# Patient Record
Sex: Male | Born: 1960 | Race: White | Hispanic: Yes | Marital: Married | Smoking: Never smoker
Health system: Southern US, Community
[De-identification: ages and names within clinical notes are randomized; demographics above are authoritative.]

## PROBLEM LIST (undated history)

## (undated) DIAGNOSIS — I359 Nonrheumatic aortic valve disorder, unspecified: Secondary | ICD-10-CM

## (undated) HISTORY — DX: Nonrheumatic aortic valve disorder, unspecified: I35.9

---

## 2002-05-04 HISTORY — PX: CARDIAC SURGERY: SHX584

## 2003-01-10 ENCOUNTER — Ambulatory Visit (HOSPITAL_COMMUNITY): Admission: RE | Admit: 2003-01-10 | Discharge: 2003-01-11 | Payer: Self-pay | Admitting: Cardiology

## 2003-01-11 ENCOUNTER — Encounter: Payer: Self-pay | Admitting: Cardiology

## 2003-01-17 ENCOUNTER — Encounter: Payer: Self-pay | Admitting: Cardiothoracic Surgery

## 2003-01-17 ENCOUNTER — Encounter (INDEPENDENT_AMBULATORY_CARE_PROVIDER_SITE_OTHER): Payer: Self-pay | Admitting: Specialist

## 2003-01-17 ENCOUNTER — Inpatient Hospital Stay (HOSPITAL_COMMUNITY): Admission: RE | Admit: 2003-01-17 | Discharge: 2003-01-25 | Payer: Self-pay | Admitting: Cardiothoracic Surgery

## 2003-01-18 ENCOUNTER — Encounter: Payer: Self-pay | Admitting: Cardiothoracic Surgery

## 2003-01-19 ENCOUNTER — Encounter: Payer: Self-pay | Admitting: Cardiothoracic Surgery

## 2003-01-20 ENCOUNTER — Encounter: Payer: Self-pay | Admitting: Cardiothoracic Surgery

## 2003-01-21 ENCOUNTER — Encounter: Payer: Self-pay | Admitting: Thoracic Surgery (Cardiothoracic Vascular Surgery)

## 2003-01-22 ENCOUNTER — Encounter: Payer: Self-pay | Admitting: Cardiothoracic Surgery

## 2003-01-23 ENCOUNTER — Encounter: Payer: Self-pay | Admitting: Cardiology

## 2003-01-24 ENCOUNTER — Encounter: Payer: Self-pay | Admitting: Cardiothoracic Surgery

## 2008-04-26 ENCOUNTER — Ambulatory Visit: Payer: Self-pay | Admitting: *Deleted

## 2008-05-04 HISTORY — PX: RENAL ARTERY BYPASS: SHX2318

## 2008-08-15 ENCOUNTER — Ambulatory Visit: Payer: Self-pay | Admitting: *Deleted

## 2008-08-15 ENCOUNTER — Ambulatory Visit (HOSPITAL_COMMUNITY): Admission: RE | Admit: 2008-08-15 | Discharge: 2008-08-15 | Payer: Self-pay | Admitting: *Deleted

## 2010-08-13 LAB — POCT I-STAT, CHEM 8
BUN: 10 mg/dL (ref 6–23)
Calcium, Ion: 1.13 mmol/L (ref 1.12–1.32)
Chloride: 105 mEq/L (ref 96–112)
Creatinine, Ser: 1.1 mg/dL (ref 0.4–1.5)
Glucose, Bld: 136 mg/dL — ABNORMAL HIGH (ref 70–99)

## 2010-09-16 NOTE — Op Note (Signed)
NAMEKRISTINA, BERTONE                 ACCOUNT NO.:  [id number]   MEDICAL RECORD NO.:  [id number]          PATIENT TYPE:  AMB   LOCATION:  SDS                          FACILITY:  [facility]   PHYSICIAN:  Balinda Quails, M.D.    DATE OF BIRTH:  Aug 17, 1960   DATE OF PROCEDURE:  DATE OF DISCHARGE:                               OPERATIVE REPORT   PHYSICIAN:  Balinda Quails, MD   DIAGNOSIS:  Right renal artery aneurysm.   PROCEDURES:  1. Abdominal aortogram.  2. Right renal arteriogram.   ACCESS:  Right common femoral artery 5-French sheath.   CONTRAST:  100 mL of Visipaque.   COMPLICATIONS:  None apparent.   CLINICAL NOTE:  Vance Hochmuth is a 50 year old male with a history of  aortic valve replacement and single functioning right kidney.  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  He has a history of hypertension,  hyperlipidemia.   Recent CT scan revealed evidence of right renal artery aneurysm.  No  evidence of significant left renal function.  Brought to the cath lab at  this time for diagnostic workup with arteriography.   PROCEDURE NOTE:  The patient brought to cath lab in stable condition.  Placed in supine position.  Both groins prepped and draped in sterile  fashion.  Right groin instilled 1% Xylocaine.  An 18-gauge needle  introduced into right common femoral artery.  0.035 Wholey guidewire  advanced through the needle into the mid abdominal aorta.  A 5-French  sheath advanced over the guidewire.   Pigtail catheter advanced over guidewire to the juxtarenal aorta.  AP  abdominal aortogram obtained.  This verified a single functioning right  kidney.  No significant left kidney function identified.  No left renal  artery identified.  The infrarenal aorta was normal.  The common and  external and hypogastric iliac arteries bilaterally were normal.   Guidewire then reinserted and a short right coronary catheter advanced  over the guidewire.  This was engaged in the right renal  artery origin.   Hand injection of contrast used to obtain right renal arteriography.  This revealed a large right renal artery, which was widely patent.  There was a saccular aneurysm of the terminal main renal artery at the  origin of the first division branches of which there were three.  Several images of this were taken.   There were no apparent complications of the procedure.  The guidewire  reinserted.  Catheter removed.  Right femoral sheath removed.  Total  contrast 100 mL Visipaque.   FINAL IMPRESSION:  1. Normal infrarenal aorta and iliac segments.  2. Widely patent right renal artery.  3. Single right kidney function.  4. 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   DISPOSITION:  These results have been reviewed with the patient and his  wife.  Dr. Salli Real at [facility] will be contacted  for further management of this complex problem.      Balinda Quails, M.D.  Electronically Signed     PGH/MEDQ  D:  08/15/2008  T:  08/16/2008  Job:  [id number]   cc:   Fayrene Fearing L. Deterding, M.D.  Dina Rich  [place] J. Stevphen Rochester, MD

## 2010-09-16 NOTE — Consult Note (Signed)
VASCULAR SURGERY CONSULTATION   ELIN, SEATS S  DOB:  1960-07-22                                       04/26/2008  CHART#:[id number]   PRIMARY CARE PHYSICIAN:  Dina Rich, M.D.   REFERRAL DIAGNOSIS:  Right renal artery aneurysm.   HISTORY:  The patient is a 50 year old gentleman with a history of  aortic valve replacement carried out by Dr. Tyrone Sage in 2004.  He has a  history of abdominal aortic aneurysm and an atrophic left kidney.  Right  renal artery aneurysm reported to be 2 cm in diameter.   A long history of hypertension and dyslipidemia.   PAST MEDICAL HISTORY:  1. Coronary artery disease.  2. Essential hypertension.  3. Aortic valve replacement.  4. Dyslipidemia.   MEDICATIONS:  1. Aspirin 81 mg daily.  2. Lipitor 80 mg every other day.  3. Lipitor 40 mg every other day.  4. Ramipril 2.5 mg twice daily.  5. Metoprolol 50 mg twice daily.  6. Coumadin 7 mg every other day.  7. Coumadin 6 mg every other day.   ALLERGIES:  1. Penicillin.  2. Sulfa.   SOCIAL HISTORY:  The patient is married, no children.  No tobacco use.  Social alcohol intake.  Works as a Oncologist.  Married.   FAMILY HISTORY:  Mother deceased age 60, she had a history of mitral  valve replacement, diabetes and gallbladder problems.  Father living age  38 with history of an MI.   REVIEW OF SYSTEMS:  Refer to patient encounter form.  Weight stable, no  anorexia.  Denies fever or chills.  No recent chest pain or shortness of  breath.  No supplemental oxygen use.  No chronic cough.  No GI symptoms.  Denies dysuria or frequency.   PHYSICAL EXAMINATION:  General:  A well-appearing 50 year old gentleman.  Alert and oriented, no acute distress.  BP 141/84, pulse is 75 per  minute.  Abdomen:  Soft, nontender.  No masses or organomegaly.  Normal  bowel sounds without bruits.  Lower Extremities:  With intact femoral  pulses bilaterally.  No ankle edema.   IMPRESSION:  1. Right renal artery aneurysm by CT scan.  2. Hypertension.  3. Dyslipidemia.  4. Coronary artery disease.  5. Aortic valve replacement.   PLAN:  Will obtain images of a CT scan.  Once these are reviewed,  consider scheduling of abdominal aortogram with renal arteriography for  further evaluation.   Balinda Quails, M.D.  Electronically Signed  PGH/MEDQ  D:  04/26/2008  T:  04/30/2008  Job:  [id number]   cc:   Aundra Dubin. Revankar, M.D.  Dina Rich

## 2010-09-19 NOTE — Discharge Summary (Signed)
NAMEMURRY, DIAZ                           ACCOUNT NO.:  [id number]   MEDICAL RECORD NO.:  [id number]                   PATIENT TYPE:  OIB   LOCATION:  4728                                 FACILITY:  [facility]   PHYSICIAN:  Arturo Morton. Riley Kill, M.D.             DATE OF BIRTH:  [birth year]   DATE OF ADMISSION:  01/10/2003  DATE OF DISCHARGE:  01/11/2003                                 DISCHARGE SUMMARY   DISCHARGE DIAGNOSES:  1. Critical aortic stenosis.  2. Hypertension.  3. Dyslipidemia.   PROCEDURES PERFORMED THIS ADMISSION:  Cardiac catheterization on January 10, 2003, by Dr. Riley Kill revealing critical aortic stenosis with a mean  aortic valve gradient of 85 mmHg, trivial nonobstructive coronary artery  disease with 30% LAD stenosis, and a 15 mmHg pulmonary valve gradient.   CONSULTATIONS THIS ADMISSION:  CVTS.   HISTORY OF PRESENT ILLNESS:  Briefly, this 50 year old male, husband of  Elease Hashimoto A. Benedetto Goad, M.D., with known aortic stenosis, presented to Dr.  Kem Parkinson office on January 11, 2003, with complaints of exertional chest  discomfort.  He was set up for elective cardiac catheterization to further  evaluate his aortic stenosis and coronary anatomy.  He was brought in on  January 10, 2003, by Dr. Riley Kill.   HOSPITAL COURSE:  He underwent procedure as noted above.  He tolerated the  procedure well and had no immediate complications.  He underwent  preoperative transesophageal echocardiogram to further evaluate his  pulmonary valve.  I do not have that report at this time.  Apparently it  looked okay, per the nursing staff.  Dr. Tyrone Sage saw the patient in CVTS  consultation.  They plan aortic valve replacement on January 17, 2003.  The patient is being discharged to home today with return set up for  January 17, 2003.   LABORATORY DATA:  White count 6500, hemoglobin 12.6, hematocrit 37.3,  platelet count 182,000.  INR 1.1.  Sodium 138, potassium 3.5, chloride  108,  CO2 22, glucose 113, BUN 11, creatinine 0.9, total bilirubin 1.0, alkaline  phosphatase 58, AST 25, ALT 38, total bilirubin 6.7, albumin 3.6, calcium  8.4.  Hemoglobin A1C 5.6.   DISCHARGE MEDICATIONS:  Tylenol p.r.n. pain.   ACTIVITY:  No driving, heavy lifting, exertion, work, or sex until he  returns for his surgery.   DIET:  Low fat, low sodium.   WOUND CARE:  The patient is to call our office in [place], [place], for any groin swelling, bleeding, or bruising.    FOLLOWUP:  He is to return to [facility] on January 17, 2003, for aortic  valve replacement surgery by Dr. Tyrone Sage.      Tereso Newcomer, P.A.                        Arturo Morton. Riley Kill, M.D.    SW/MEDQ  D:  01/11/2003  T:  01/11/2003  Job:  [id number]   cc:   Gwenith Daily. Tyrone Sage, M.D.  [address]  [place]  [place] [address]

## 2010-09-19 NOTE — Discharge Summary (Signed)
NAMEKAHLIL, COWANS                           ACCOUNT NO.:  [id number]   MEDICAL RECORD NO.:  [id number]                   PATIENT TYPE:  INP   LOCATION:  2012                                 FACILITY:  [facility]   PHYSICIAN:  Pecola Leisure, PA                DATE OF BIRTH:  08-01-1960   DATE OF ADMISSION:  01/17/2003  DATE OF DISCHARGE:                                 DISCHARGE SUMMARY   ADMIT DIAGNOSIS:  Critical aortic stenosis.   PAST MEDICAL HISTORY:  1. Hypertension.  2. Dyslipidemia.  3. Aortic stenosis.   ALLERGIES:  1. SULFA.  2. PENICILLIN.   DISCHARGE DIAGNOSES:  1. Aortic stenosis, status post aortic valve replacement with a St. Jude     valve.   BRIEF HISTORY:  The patient is a 50 year old male with a long-standing  history of a murmur, who presented to Dr. Aundra Dubin. Revankar with increasing  episodes of shortness of breath and vague chest discomfort.  This led to  further evaluation with echocardiogram and cardiac catheterization, which  confirmed the diagnosis of critical aortic stenosis.  The patient was then  referred to Dr. Ramon Dredge B. Gerhardt for evaluation of aortic stenosis and  valve replacement surgery.  Prior to surgery, transesophageal  echocardiography and transthoracic echocardiographies were performed,  demonstrating mild infundibular stenosis but with a normal pulmonic valve.  Transesophageal echocardiography revealed a freely movable pulmonic valve.  It was Dr. Dennie Maizes opinion that the patient should undergo aortic valve  replacement surgery with a St. Jude mechanical valve prosthesis.   HOSPITAL COURSE:  The patient was admitted on January 10, 2003 for cardiac  catheterization prior to aortic valve replacement surgery.  The  catheterization revealed a normal left ventricular function, 30% occlusion  of the left circumflex and severe aortic stenosis.  The patient was  discharged home after the cardiac catheterization.  The patient was  readmitted and taken to the OR on January 17, 2003 for aortic valve  replacement with a 23-mm St. Jude aortic valve.  The patient tolerated the  procedure well.  He was hemodynamically stable immediately postoperatively  and was extubated without problem.  The patient woke up from anesthesia  neurologically intact.  The patient remained stable in the ICCU and was  transferred to the 2000 unit of postoperative day 4.  Coumadin therapy was  started on postoperative day 1.  The patient began cardiac rehab on  postoperative day 3 and tolerated it well.  Despite increases in Coumadin  dose, the patient's INR was not therapeutic, therefore, Lovenox was started  on postoperative day #5.  The patient had an episode of sinus tachycardia on  postoperative day #5 and as a result, the Lopressor dose was increased.  The  patient has remained stable postoperatively and progressed well.  The  patient will be discharged on Lovenox, which will be given at home by his  wife.  LABORATORY DATA:  CBC on January 20, 2003:  White count 8.4, hemoglobin  10.3, hematocrit 29.9, platelets 203,000.  BMP on January 22, 2003:  Sodium 139, potassium 4.5, BUN 11, creatinine 0.9 and glucose 127.  PT/INR  on January 23, 2003 are 14.3 and 1.2.   CONDITION ON DISCHARGE:  Condition on discharge is improved.   INSTRUCTIONS:   MEDICATIONS:  1. Coumadin -- the dose will be determined prior to discharge.  2. Lovenox 80 mg subcu injection q.12 h.  3. Colace 100 mg two tabs p.o. daily.  4. Folic acid 1 mg p.o. daily.  5. Altace 2.5 mg one p.o. b.i.d.  6. Lopressor 50 mg one p.o. b.i.d.  7. Vioxx 25 mg one p.o. daily.  8. Pain management -- Tylox one to two p.o. q.4-6 h. p.r.n. for pain.   ACTIVITY:  1. No driving until followup with the surgeon.  2. No heavy lifting, pulling or pushing.  3. Continue daily walks and breathing exercises.   DIET:  Heart-healthy.   WOUND CARE:  1. Shower daily.  2. Cleaning  wounds with soap and water.  3. If the incisions become red, swollen, painful or draining or fever of     101, call CVTS.  4. No creams or lotions on the incisions.   SPECIAL INSTRUCTIONS:  The patient is to call the CVTS office with any  questions or problems.   FOLLOWUP:  1. Dr. Tyrone Sage, Thursday, March 15, 2003, at 12:30 p.m.  2. Dr. Maisie Fus D. Stuckey; Trish from the [facility] will discuss the     followup appointment with the patient.  The PT/INR will be followed by     Dr. Rosalyn Charters office and the patient is to have this level check on     Friday, January 26, 2003.                                                Pecola Leisure, PA    AY/MEDQ  D:  01/23/2003  T:  01/25/2003  Job:  [id number]   cc:   Gwenith Daily. Tyrone Sage, M.D.  [address]  [place]  [place] [address]   Arturo Morton. Riley Kill, M.D.

## 2010-09-19 NOTE — Cardiovascular Report (Signed)
NAMEKEYION, KNACK                           ACCOUNT NO.:  [id number]   MEDICAL RECORD NO.:  [id number]                   PATIENT TYPE:  OIB   LOCATION:  4728                                 FACILITY:  [facility]   PHYSICIAN:  Arturo Morton. Riley Kill, M.D.             DATE OF BIRTH:  Mar 01, 1961   DATE OF PROCEDURE:  01/10/2003  DATE OF DISCHARGE:  01/11/2003                              CARDIAC CATHETERIZATION   INDICATIONS:  Mr. Withers is a very delightful 50 year old gentleman who  presents with some progressive shortness of breath and chest tightness.  This gentleman has a known history of aortic valvular stenosis.  Repeat  echocardiogram demonstrated significant aortic valvular disease.  The  current study was done to assess his aortic valve gradient as well as to  assess coronary anatomy.   DESCRIPTION OF PROCEDURE:  The patient was brought to the catheterization  lab and prepped and draped in the usual fashion.  Through an anterior  puncture, the right femoral vein was entered.  Right heart catheterization  was performed using a 7.5 French thermodilution Swan-Ganz catheter through  an 8 French sheath.  Saturations were obtained in the pulmonary artery.  Following right heart catheterization, the right femoral artery was entered  and a 6 French sheath was placed.  We were able to cross the valve using a  right coronary catheter and a straight wire.  A ventricular gradient was  measured.  Because of prior echocardiography, no ventriculogram was  performed.  There was some difficulty keeping the left coronary catheter in  the left coronary ostium because of the jet force of the aortic gradient.  Views of the left and right coronary arteries were obtained.  Incidentally,  a gradient was noted across the pulmonic outflow tract.  As a result, this  was measured on several occasions.  Aortography was performed without  complication.  All catheters were subsequently removed and hemostasis  achieved in the holding area by direct manual compression.   HEMODYNAMIC DATA:  1. Right atrium 11/10/8.  2. Right ventricle 39/5/10.  3. Pulmonary artery 26/17/21.  4. Pulmonary capillary wedge 18/15/15.  5. Aorta 146/103/122.  6. Left ventricle 273/23/31.  7. Peak to peak gradient 120 mmHg.  8. Aortic valve gradient 87 mm mean.  9. Aortic valve area 0.48 sq. Cm.  10.      Fick cardiac output 5.8 L/min.  11.      Fick cardiac index 2.82 L/min. per sq. m.  12.      Pulmonary artery saturation 72%.  13.      Aortic saturation 97%.   ANGIOGRAPHIC DATA:  1. The left main coronary artery demonstrates some tapered narrowing of     about 20-30% at the ostium.  Importantly, this is not obstructive and     significantly larger than the diagnostic catheter.  2. The left anterior descending artery courses to the apex and  provides a     major diagonal branch.  There is some suggestion of eccentric plaquing of     perhaps 30% after the takeoff of the diagonal branch.  The distal LAD     appears to be widely patent, as does the diagonal branch.  3. The circumflex provides three marginal branches.  It is a large system     that wraps out toward the apex.  The circumflex and its branches appear     to be free of critical disease.  4. The right coronary artery is a large-caliber vessel providing posterior     descending and posterolateral branch.  5. The aortic root does not demonstrate significant aortic regurgitation.     There is mild aortic root dilatation.   CONCLUSIONS:  1. Critical aortic stenosis.  2. Approximate 15 mm pulmonic outflow tract gradient of uncertain etiology.  3. Mild coronary irregularities involving the left coronary system as     described above, with nonobstructive disease.   RECOMMENDATIONS:  1. Surgical consultation for likely aortic valve replacement.  2. Transesophageal echocardiography to better identify the left ventricular     outflow tract.  3. Mild  coronary irregularities with noted elevation in LDL cholesterol.     Recommend cholesterol-lowering therapy.                                               Arturo Morton. Riley Kill, M.D.    TDS/MEDQ  D:  01/15/2003  T:  01/15/2003  Job:  [id number]   cc:   Aundra Dubin. Revankar, M.D.  [address]  [place]  [place] [address]  Fax: [phone]   Gilford Rile. Benedetto Goad, M.D.  [address]  [place]  [place] [address]  Fax: [phone]   Cardiovascular Laboratory

## 2010-09-19 NOTE — Op Note (Signed)
NAMEKESHON, MARKOVITZ                           ACCOUNT NO.:  [id number]   MEDICAL RECORD NO.:  [id number]                   PATIENT TYPE:  INP   LOCATION:  2301                                 FACILITY:  [facility]   PHYSICIAN:  Zenon Mayo, MD            DATE OF BIRTH:  [birth year]   DATE OF PROCEDURE:  01/17/2003  DATE OF DISCHARGE:                                 OPERATIVE REPORT   PROCEDURE PERFORMED:  Transesophageal echocardiogram.   INDICATIONS FOR PROCEDURE:  Mr. Vuncannon is a 50 year old gentleman with known  history of aortic stenosis who has recently had a progression of his  symptoms which include shortness of breath with minimal exertion.  He was  scheduled to have aortic valve replacement today and transesophageal  echocardiogram was requested by Ramon Dredge B. Tyrone Sage, M.D. to evaluate the  valve further.  The patient was brought to the operating room and placed  under general anesthesia.  Once the endotracheal tube position was  confirmed.  A transesophageal echo probe was placed blindly with no  resistance to placement.  The following views were noted.   1 - Left ventricle.  The left ventricle showed an ejection fraction of  approximately 60%.  There was left ventricular hypertrophy noted.  There  were no wall motion abnormalities noted.   2 - Mitral valve.  The mitral valve revealed minimal or trace mitral  regurge.  The leaflets were normal in appearance and coapted well.   3 - Aortic valve.  The aortic valve was heavily calcified.  An aortic valve  area was unable to be measured secondary to the amount of calcification.  Minimal movement of the leaflets.  There was also trace aortic  insufficiency.  The peak gradient measured by pulse wave Doppler was 90 mmHg  and the mean gradient was measured at 56 mmHg via the transgastric approach.  Next, the tricuspid valve was normal in appearance and no tricuspid regurge  was noted.  Next, pulmonic valve was difficult to  visualize.  There was no  pulmonic regurgitation noted.   4 - Left atrium.  The left atrium was normal in size.  There was no clot  noted in the left atrial appendage.   5 - Thoracic aorta.  There was no disease noted in the thoracic aorta.   At the completion of bypass, the heart was once again evaluated.  A St. Jude  valve appeared to be in good position with the leaflets moving well.  There  was no aortic insufficiency noted.  The aortic cannula was removed without  any evidence of aortic dissection.  All other structures in the heart  remained the same as prebypass.  At the end of the procedure, the echo probe  was removed without difficulty and the patient was taken to the intensive  care unit in stable condition.  Zenon Mayo, MD    WEF/MEDQ  D:  01/17/2003  T:  01/17/2003  Job:  [id number]

## 2010-09-19 NOTE — Op Note (Signed)
Mark Delgado, Mark Delgado                           ACCOUNT NO.:  [id number]   MEDICAL RECORD NO.:  [id number]                   PATIENT TYPE:  INP   LOCATION:  2301                                 FACILITY:  [facility]   PHYSICIAN:  Gwenith Daily. Tyrone Sage, M.D.            DATE OF BIRTH:  [birth year]   DATE OF PROCEDURE:  01/17/2003  DATE OF DISCHARGE:                                 OPERATIVE REPORT   PREOPERATIVE DIAGNOSIS:  Critical aortic stenosis.   POSTOPERATIVE DIAGNOSIS:  Critical aortic stenosis.   SURGICAL PROCEDURE:  Aortic valve replacement with a #23 St. Jude mechanical  aortic valve prosthesis.   SURGEON:  Gwenith Daily. Tyrone Sage, M.D.   FIRST ASSISTANT:  Coral Ceo, P.A.   BRIEF HISTORY:  The patient is a 50 year old male with a long-standing  history of a murmur who presented with increasing episodes of shortness of  breath and vague chest discomfort which lead to further evaluation with  echocardiogram and cardiac catheterization which confirmed the diagnosis of  critical aortic stenosis with an estimated aortic valve area of 0.43 by  echo.  There was also a question of pulmonic valve stenosis.  At the time of  catheterization, there was an estimated 15 mm gradient across the pulmonic  valve.  Prior to surgery, transesophageal echo and transthoracic echos were  performed demonstrating mild infundibular stenosis but with a normal  pulmonic valve.  This was also confirmed at the time of surgery.  With  placement of Swan-Ganz catheter, there was no gradient appreciated.  The  transesophageal echo and also hand held echo probe directly on the heart  revealed a freely moveable pulmonic valve.  The risks and options of surgery  were discussed with the patient and the valve options and after considering  this, he agreed with placement of mechanical valve understanding a need for  life long anticoagulation.   DESCRIPTION OF PROCEDURE:  With Swan-Ganz and arterial line monitors in  place, the patient underwent general endotracheal anesthesia without  incident.  The skin of the chest and legs were prepped with Betadine and  draped in the usual sterile manner.  A median sternotomy was performed, the  pericardium was opened.  The patient had evidence of very severe left  ventricular  hypertrophy.  He was systemically heparinized.  The ascending  aorta and right atrium were cannulated and aortic root vent cardioplegia  needle was introduced into the ascending aorta.  The patient was placed on  cardiopulmonary bypass at 2.4 liters per minute per meter squared.  The site  of aortotomy was exposed.  The patient's body temp was cooled to 30 degrees.  The aortic Cross clamp was applied and 800 mL of cold blood potassium  cardioplegia was administered through the aortic root with diastolic arrest  of the heart.  The myocardial septal temperature was monitored throughout  the crossclamp.   A transverse aortotomy was performed.  This revealed a very highly calcified  aortic valve.  The ascending aorta was of normal size.  The valve was  excised and the annulus debrided, taking care to remove all calcific debris.  The valve was then sized for a 23 St. Jude mechanical valve, model  [id number], serial [id number].  #2 Tycron pledgeted sutures were placed  circumferentially in the annulus with the pledgets on the ventricular  surface.  The valve was then secured in place and seated well.  There was  free movements of the leaflets.  Intermittently during the crossclamp  period, cold blood potassium cardioplegia was administered directly into the  coronary ostium.  The aortotomy was closed with a horizontal mattress 3-0  Prolene suture over felt strips.  Prior to complete closure, the heart was  allowed to passively fill and deair.  The aortic Cross clamp was removed  with a total Cross clamp time of 95 minutes.  The patient spontaneously  converted to a sinus rhythm.  A 16 gauge needle  was introduced in the left  ventricular  apex to further deair the heart.  The patient's body  temperature rewarmed to 37 degrees.  He was then ventilated and weaned from  cardiopulmonary bypass after removal of the right superior pulmonary vent  which had been placed.  Transesophageal echo showed good function of the  aortic valve.  Protamine sulfate was administered.  After decannulation, two  atrial and two ventricular pacing wires were applied.  The pericardium was  loosely reapproximated.  Two mediastinal tubes were left in place.  The  sternum was closed with #6 stainless steel wire, fascia was closed with  interrupted 0 Vicryl, running 3-0 Vicryl in the subcutaneous tissue, and 4-0  subcuticular stitch in the skin edges.  Dry dressings were applied.  Sponge  and needle counts were reported as correct at the completion of the  procedure.  The patient tolerated the procedure without obvious  complications and was transferred to the surgical intensive care unit for  further postoperative care.                                               Gwenith Daily Tyrone Sage, M.D.    Tyson Babinski  D:  01/19/2003  T:  01/19/2003  Job:  [id number]

## 2011-08-31 DIAGNOSIS — I722 Aneurysm of renal artery: Secondary | ICD-10-CM | POA: Insufficient documentation

## 2016-01-13 ENCOUNTER — Ambulatory Visit: Payer: Self-pay | Admitting: Licensed Clinical Social Worker

## 2016-07-01 DIAGNOSIS — F4329 Adjustment disorder with other symptoms: Secondary | ICD-10-CM | POA: Insufficient documentation

## 2017-01-12 DIAGNOSIS — F4320 Adjustment disorder, unspecified: Secondary | ICD-10-CM | POA: Insufficient documentation

## 2017-02-25 ENCOUNTER — Other Ambulatory Visit: Payer: Self-pay

## 2017-03-03 ENCOUNTER — Ambulatory Visit (INDEPENDENT_AMBULATORY_CARE_PROVIDER_SITE_OTHER): Payer: Managed Care, Other (non HMO) | Admitting: Cardiology

## 2017-03-03 ENCOUNTER — Encounter: Payer: Self-pay | Admitting: Cardiology

## 2017-03-03 VITALS — BP 130/70 | HR 62 | Ht 65.0 in | Wt 213.8 lb

## 2017-03-03 DIAGNOSIS — Q600 Renal agenesis, unilateral: Secondary | ICD-10-CM | POA: Diagnosis not present

## 2017-03-03 DIAGNOSIS — Z952 Presence of prosthetic heart valve: Secondary | ICD-10-CM

## 2017-03-03 DIAGNOSIS — E782 Mixed hyperlipidemia: Secondary | ICD-10-CM | POA: Insufficient documentation

## 2017-03-03 DIAGNOSIS — I10 Essential (primary) hypertension: Secondary | ICD-10-CM | POA: Diagnosis not present

## 2017-03-03 DIAGNOSIS — IMO0002 Reserved for concepts with insufficient information to code with codable children: Secondary | ICD-10-CM | POA: Insufficient documentation

## 2017-03-03 NOTE — Patient Instructions (Signed)
Medication Instructions:  Your physician recommends that you continue on your current medications as directed. Please refer to the Current Medication list given to you today.  Labwork: None  Testing/Procedures: Your physician has requested that you have an echocardiogram. Echocardiography is a painless test that uses sound waves to create images of your heart. It provides your doctor with information about the size and shape of your heart and how well your heart's chambers and valves are working. This procedure takes approximately one hour. There are no restrictions for this procedure.  Follow-Up: Your physician recommends that you schedule a follow-up appointment in: 6 months  Any Other Special Instructions Will Be Listed Below (If Applicable).     If you need a refill on your cardiac medications before your next appointment, please call your pharmacy.   [facility]  Ashley A, RN, BSN  Echocardiogram An echocardiogram, or echocardiography, uses sound waves (ultrasound) to produce an image of your heart. The echocardiogram is simple, painless, obtained within a short period of time, and offers valuable information to your health care provider. The images from an echocardiogram can provide information such as:  Evidence of coronary artery disease (CAD).  Heart size.  Heart muscle function.  Heart valve function.  Aneurysm detection.  Evidence of a past heart attack.  Fluid buildup around the heart.  Heart muscle thickening.  Assess heart valve function.  Tell a health care provider about:  Any allergies you have.  All medicines you are taking, including vitamins, herbs, eye drops, creams, and over-the-counter medicines.  Any problems you or family members have had with anesthetic medicines.  Any blood disorders you have.  Any surgeries you have had.  Any medical conditions you have.  Whether you are pregnant or may be pregnant. What happens before the  procedure? No special preparation is needed. Eat and drink normally. What happens during the procedure?  In order to produce an image of your heart, gel will be applied to your chest and a wand-like tool (transducer) will be moved over your chest. The gel will help transmit the sound waves from the transducer. The sound waves will harmlessly bounce off your heart to allow the heart images to be captured in real-time motion. These images will then be recorded.  You may need an IV to receive a medicine that improves the quality of the pictures. What happens after the procedure? You may return to your normal schedule including diet, activities, and medicines, unless your health care provider tells you otherwise. This information is not intended to replace advice given to you by your health care provider. Make sure you discuss any questions you have with your health care provider. Document Released: 04/17/2000 Document Revised: 12/07/2015 Document Reviewed: 12/26/2012 Elsevier Interactive Patient Education  2017 Elsevier Inc.  

## 2017-03-03 NOTE — Progress Notes (Signed)
Cardiology Office Note:    Date:  03/03/2017   ID:  Mark Delgado, DOB [birth year], MRN [id number]  PCP:  Ernestene Kiel, MD  Cardiologist:  Jenean Lindau, MD   Referring MD: No ref. provider found    ASSESSMENT:    1. H/O aortic valve replacement   2. Essential hypertension   3. Mixed dyslipidemia    PLAN:    In order of problems listed above:  1. I reassured him about my findings today. His blood pressure stable. Importance of regular exercise stressed and weight reduction stressed. 2. Diet was discussed with dyslipidemia. This is managed by his primary care physician. 3. He will have an echocardiogram to assess prosthetic aortic valve function. 4. Patient will be seen in follow-up appointment in 6 months or earlier if the patient has any concerns.    Medication Adjustments/Labs and Tests Ordered: Current medicines are reviewed at length with the patient today.  Concerns regarding medicines are outlined above.  No orders of the defined types were placed in this encounter.  No orders of the defined types were placed in this encounter.    History of Present Illness:    Mark Delgado is a 57 y.o. male who is being seen today for the evaluation of postherpetic valve replacement at the request of his primary care physician. Patient is a pleasant 56 year old male. He has past medical history of essential hypertension and dyslipidemia. Patient has undergone aortic valve replacement in the remote past and is on warfarin therapy managed by his primary care physician. He denies any problems at this time. He takes care of activities of daily living. No chest pain orthopnea or PND. He walks on a regular basis. I have taken care of this gentleman in my previous practice and is here to be transferring his care to my current practice.  Past Medical History:  Diagnosis Date  . Aortic valve defect     History reviewed. No pertinent surgical history.  Current  Medications: Current Meds  Medication Sig  . allopurinol (ZYLOPRIM) 100 MG tablet Take 200 mg by mouth daily.   Marland Kitchen amLODipine (NORVASC) 5 MG tablet Take 5 mg by mouth 2 (two) times daily.   Marland Kitchen aspirin EC 81 MG tablet Take 81 mg by mouth.  . escitalopram (LEXAPRO) 10 MG tablet Take 10 mg by mouth.  . levothyroxine (SYNTHROID, LEVOTHROID) 50 MCG tablet TAKE ONE TABLET BY MOUTH ON AN EMPTY STOMACH EVERY MORNING  . loratadine (CLARITIN) 10 MG tablet Take 10 mg by mouth daily.  . metoprolol succinate (TOPROL-XL) 100 MG 24 hr tablet Take 100 mg by mouth 2 (two) times daily.   . pantoprazole (PROTONIX) 40 MG tablet Take 40 mg by mouth.  . ramipril (ALTACE) 5 MG capsule Take 5 mg by mouth 2 (two) times daily.   . rosuvastatin (CRESTOR) 20 MG tablet Take 20 mg by mouth.  . warfarin (JANTOVEN) 6 MG tablet Take 6 mg by mouth daily. 6 mg daily except 9 mg Tuesdays and Friday.     Allergies:   Heparin (bovine); Penicillin g; and Sulfamethoxazole   Social History   Social History  . Marital status: Married    Spouse name: N/A  . Number of children: N/A  . Years of education: N/A   Social History Main Topics  . Smoking status: Never Smoker  . Smokeless tobacco: Never Used  . Alcohol use 1.2 oz/week    1 Cans of beer, 1 Glasses of wine per week  Comment: maybe once a week   . Drug use: No  . Sexual activity: Not Asked   Other Topics Concern  . None   Social History Narrative  . None     Family History: The patient's family history includes Valvular heart disease in his mother.  ROS:   Please see the history of present illness.    All other systems reviewed and are negative.  EKGs/Labs/Other Studies Reviewed:    The following studies were reviewed today:  I reviewed his records.EKG reveals sinus rhythm with nonspecific ST-T changes.   Recent Labs: No results found for requested labs within last 8760 hours.  Recent Lipid Panel No results found for: CHOL, TRIG, HDL, CHOLHDL,  VLDL, LDLCALC, LDLDIRECT  Physical Exam:    VS:  BP 130/70   Pulse 62   Ht 5\' 5"  (1.651 m)   Wt 213 lb 12.8 oz (97 kg)   SpO2 94%   BMI 35.58 kg/m     Wt Readings from Last 3 Encounters:  03/03/17 213 lb 12.8 oz (97 kg)     GEN: Patient is in no acute distress HEENT: Normal NECK: No JVD; No carotid bruits LYMPHATICS: No lymphadenopathy CARDIAC: S1 S2 regular, 2/6 systolic murmur at the apex. RESPIRATORY:  Clear to auscultation without rales, wheezing or rhonchi  ABDOMEN: Soft, non-tender, non-distended MUSCULOSKELETAL:  No edema; No deformity  SKIN: Warm and dry NEUROLOGIC:  Alert and oriented x 3 PSYCHIATRIC:  Normal affect    Signed, Jenean Lindau, MD  03/03/2017 3:52 PM    [facility]

## 2017-03-04 NOTE — Addendum Note (Signed)
Addended by: Mattie Marlin on: 03/04/2017 11:21 AM   Modules accepted: Orders

## 2017-03-30 ENCOUNTER — Ambulatory Visit (HOSPITAL_BASED_OUTPATIENT_CLINIC_OR_DEPARTMENT_OTHER)
Admission: RE | Admit: 2017-03-30 | Discharge: 2017-03-30 | Disposition: A | Discharge status: Home / Self Care | Payer: Managed Care, Other (non HMO) | Source: Ambulatory Visit | Attending: Cardiology | Admitting: Cardiology

## 2017-03-30 DIAGNOSIS — I10 Essential (primary) hypertension: Secondary | ICD-10-CM | POA: Diagnosis not present

## 2017-03-30 DIAGNOSIS — I5189 Other ill-defined heart diseases: Secondary | ICD-10-CM | POA: Insufficient documentation

## 2017-03-30 DIAGNOSIS — Z952 Presence of prosthetic heart valve: Secondary | ICD-10-CM | POA: Insufficient documentation

## 2017-03-30 DIAGNOSIS — E785 Hyperlipidemia, unspecified: Secondary | ICD-10-CM | POA: Diagnosis not present

## 2017-03-30 NOTE — Progress Notes (Signed)
Echocardiogram 2D Echocardiogram has been performed.  Joelene Millin 03/30/2017, 9:51 AM

## 2017-05-04 HISTORY — PX: WISDOM TOOTH EXTRACTION: SHX21

## 2017-10-21 ENCOUNTER — Encounter: Payer: Self-pay | Admitting: Cardiology

## 2017-10-21 ENCOUNTER — Ambulatory Visit: Payer: Managed Care, Other (non HMO) | Admitting: Cardiology

## 2017-10-21 VITALS — BP 132/68 | HR 62 | Ht 65.0 in | Wt 217.0 lb

## 2017-10-21 DIAGNOSIS — I722 Aneurysm of renal artery: Secondary | ICD-10-CM

## 2017-10-21 DIAGNOSIS — E782 Mixed hyperlipidemia: Secondary | ICD-10-CM | POA: Diagnosis not present

## 2017-10-21 DIAGNOSIS — Z952 Presence of prosthetic heart valve: Secondary | ICD-10-CM

## 2017-10-21 DIAGNOSIS — I10 Essential (primary) hypertension: Secondary | ICD-10-CM

## 2017-10-21 DIAGNOSIS — Q600 Renal agenesis, unilateral: Secondary | ICD-10-CM | POA: Diagnosis not present

## 2017-10-21 DIAGNOSIS — IMO0002 Reserved for concepts with insufficient information to code with codable children: Secondary | ICD-10-CM

## 2017-10-21 NOTE — Patient Instructions (Signed)

## 2017-10-21 NOTE — Progress Notes (Signed)
Cardiology Office Note:    Date:  10/21/2017   ID:  [name], DOB 03-07-1961, MRN [id number]  PCP:  Ernestene Kiel, MD  Cardiologist:  Jenean Lindau, MD   Referring MD: Ernestene Kiel, MD    ASSESSMENT:    1. H/O aortic valve replacement   2. Aneurysm of renal artery (HCC)   3. Essential hypertension   4. Mixed dyslipidemia   5. Solitary kidney    PLAN:    In order of problems listed above:  1. Primary prevention stressed with the patient.  Importance of compliance with diet and medications stressed and he vocalized understanding.  His blood pressure is stable.  I advised him about diet and weight reduction.  He is overweight.  He understands and promises to do better.  Blood pressure is stable. 2. His lipids are managed by his primary care physician so is his anticoagulation.Patient will be seen in follow-up appointment in 6 months or earlier if the patient has any concerns    Medication Adjustments/Labs and Tests Ordered: Current medicines are reviewed at length with the patient today.  Concerns regarding medicines are outlined above.  No orders of the defined types were placed in this encounter.  No orders of the defined types were placed in this encounter.    No chief complaint on file.    History of Present Illness:    Mark Delgado is a 57 y.o. male.  The patient has history of a metallic aortic valve replacement and essential hypertension and dyslipidemia.  He has history of renal artery aneurysm managed by his vascular surgeon.  The patient denies any problems at this time and takes care of activities of daily living.  No chest pain orthopnea or PND.  At the time of my evaluation, the patient is alert awake oriented and in no distress.  He walks on a regular basis.  Past Medical History:  Diagnosis Date  . Aortic valve defect     Past Surgical History:  Procedure Laterality Date  . CARDIAC SURGERY  2004  . RENAL ARTERY BYPASS  2010  . WISDOM  TOOTH EXTRACTION  2019    Current Medications: Current Meds  Medication Sig  . allopurinol (ZYLOPRIM) 100 MG tablet Take 200 mg by mouth daily.   Marland Kitchen amLODipine (NORVASC) 5 MG tablet Take 5 mg by mouth 2 (two) times daily.   Marland Kitchen aspirin EC 81 MG tablet Take 81 mg by mouth daily.   Marland Kitchen escitalopram (LEXAPRO) 20 MG tablet Take 20 mg by mouth.   . levothyroxine (SYNTHROID, LEVOTHROID) 50 MCG tablet TAKE ONE TABLET BY MOUTH ON AN EMPTY STOMACH EVERY MORNING  . loratadine (CLARITIN) 10 MG tablet Take 10 mg by mouth daily.  . metoprolol succinate (TOPROL-XL) 100 MG 24 hr tablet Take 100 mg by mouth 2 (two) times daily.   . pantoprazole (PROTONIX) 40 MG tablet Take 40 mg by mouth daily.   . ramipril (ALTACE) 5 MG capsule Take 5 mg by mouth 2 (two) times daily.   . rosuvastatin (CRESTOR) 20 MG tablet Take 20 mg by mouth daily.   Marland Kitchen warfarin (JANTOVEN) 6 MG tablet Take 6 mg by mouth daily. 6 mg daily except 9 mg Tuesdays and Friday.     Allergies:   Heparin (bovine); Penicillin g; and Sulfamethoxazole   Social History   Socioeconomic History  . Marital status: Married    Spouse name: Not on file  . Number of children: Not on file  . Years  of education: Not on file  . Highest education level: Not on file  Occupational History  . Not on file  Social Needs  . Financial resource strain: Not on file  . Food insecurity:    Worry: Not on file    Inability: Not on file  . Transportation needs:    Medical: Not on file    Non-medical: Not on file  Tobacco Use  . Smoking status: Never Smoker  . Smokeless tobacco: Never Used  Substance and Sexual Activity  . Alcohol use: Yes    Alcohol/week: 1.2 oz    Types: 1 Cans of beer, 1 Glasses of wine per week    Comment: maybe once a week   . Drug use: No  . Sexual activity: Not on file  Lifestyle  . Physical activity:    Days per week: Not on file    Minutes per session: Not on file  . Stress: Not on file  Relationships  . Social connections:     Talks on phone: Not on file    Gets together: Not on file    Attends religious service: Not on file    Active member of club or organization: Not on file    Attends meetings of clubs or organizations: Not on file    Relationship status: Not on file  Other Topics Concern  . Not on file  Social History Narrative  . Not on file     Family History: The patient's family history includes Diabetes in his mother; Valvular heart disease in his mother.  ROS:   Please see the history of present illness.    All other systems reviewed and are negative.  EKGs/Labs/Other Studies Reviewed:    The following studies were reviewed today: I reviewed the findings of echocardiogram with the patient at length.   Recent Labs: No results found for requested labs within last 8760 hours.  Recent Lipid Panel No results found for: CHOL, TRIG, HDL, CHOLHDL, VLDL, LDLCALC, LDLDIRECT  Physical Exam:    VS:  BP 132/68 (BP Location: Right Arm, Patient Position: Sitting, Cuff Size: Normal)   Pulse 62   Ht 5\' 5"  (1.651 m)   Wt 217 lb (98.4 kg)   SpO2 98%   BMI 36.11 kg/m     Wt Readings from Last 3 Encounters:  10/21/17 217 lb (98.4 kg)  03/03/17 213 lb 12.8 oz (97 kg)     GEN: Patient is in no acute distress HEENT: Normal NECK: No JVD; No carotid bruits LYMPHATICS: No lymphadenopathy CARDIAC: Hear sounds regular, 2/6 systolic murmur at the apex. RESPIRATORY:  Clear to auscultation without rales, wheezing or rhonchi  ABDOMEN: Soft, non-tender, non-distended MUSCULOSKELETAL:  No edema; No deformity  SKIN: Warm and dry NEUROLOGIC:  Alert and oriented x 3 PSYCHIATRIC:  Normal affect   Signed, Jenean Lindau, MD  10/21/2017 10:00 AM    [facility]

## 2019-07-06 ENCOUNTER — Other Ambulatory Visit: Payer: Self-pay | Admitting: Urology

## 2019-07-06 DIAGNOSIS — D4101 Neoplasm of uncertain behavior of right kidney: Secondary | ICD-10-CM

## 2019-07-06 DIAGNOSIS — D49511 Neoplasm of unspecified behavior of right kidney: Secondary | ICD-10-CM

## 2019-08-02 ENCOUNTER — Ambulatory Visit
Admission: RE | Admit: 2019-08-02 | Discharge: 2019-08-02 | Disposition: A | Discharge status: Home / Self Care | Payer: Managed Care, Other (non HMO) | Source: Ambulatory Visit | Attending: Urology | Admitting: Urology

## 2019-08-02 DIAGNOSIS — D49511 Neoplasm of unspecified behavior of right kidney: Secondary | ICD-10-CM

## 2019-08-02 DIAGNOSIS — D4101 Neoplasm of uncertain behavior of right kidney: Secondary | ICD-10-CM

## 2019-08-02 MED ORDER — GADOBENATE DIMEGLUMINE 529 MG/ML IV SOLN
19.0000 mL | Freq: Once | INTRAVENOUS | Status: AC | PRN
  Administered 2019-08-02: 15:00:00 19 mL via INTRAVENOUS

## 2020-07-10 ENCOUNTER — Other Ambulatory Visit: Payer: Self-pay | Admitting: Urology

## 2020-07-10 DIAGNOSIS — N281 Cyst of kidney, acquired: Secondary | ICD-10-CM

## 2020-08-04 ENCOUNTER — Ambulatory Visit
Admission: RE | Admit: 2020-08-04 | Discharge: 2020-08-04 | Disposition: A | Discharge status: Home / Self Care | Payer: Managed Care, Other (non HMO) | Source: Ambulatory Visit | Attending: Urology | Admitting: Urology

## 2020-08-04 ENCOUNTER — Other Ambulatory Visit: Payer: Self-pay

## 2020-08-04 DIAGNOSIS — N281 Cyst of kidney, acquired: Secondary | ICD-10-CM

## 2020-08-04 MED ORDER — GADOBENATE DIMEGLUMINE 529 MG/ML IV SOLN
20.0000 mL | Freq: Once | INTRAVENOUS | Status: AC | PRN
  Administered 2020-08-04: 20 mL via INTRAVENOUS

## 2020-11-10 IMAGING — MR MR ABDOMEN WO/W CM
11 of 17 series · 24 of 48 positions shown · IV contrast (Multihance)
Comparison: None.

CLINICAL DATA: Right kidney mass, solitary kidney, history of renal
artery bypass 5868

EXAM:
MRI ABDOMEN WITHOUT AND WITH CONTRAST
TECHNIQUE: Multiplanar multisequence MR imaging of the abdomen was performed
both before and after the administration of intravenous contrast.
CONTRAST:  19mL MULTIHANCE GADOBENATE DIMEGLUMINE 529 MG/ML IV SOLN

[Series 2: T2 · coronal · 5.0mm · 1.45mm/px · 1 of 31 slices shown (1 of 3)]
[im 1/31]
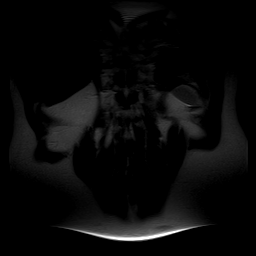

[Series 3: T2 · axial · 5.0mm · 1.41mm/px · 1 of 40 slices shown (2 of 3)]
[im 1/40]
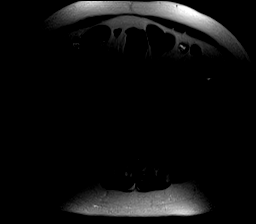

[Series 4: axial in out · axial · 5.5mm · 0.70mm/px · z∈[-111,+136]mm · 2 of 80 slices shown]
[im 1/80]
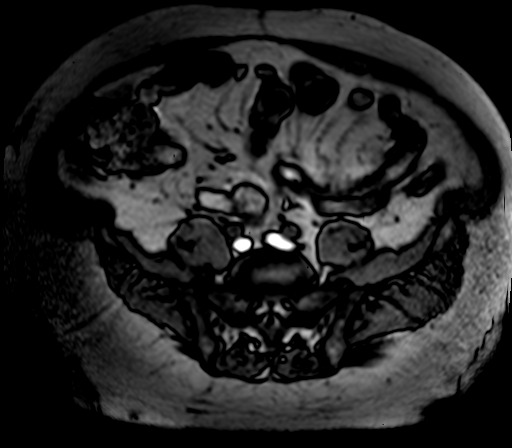
[im 80/80]
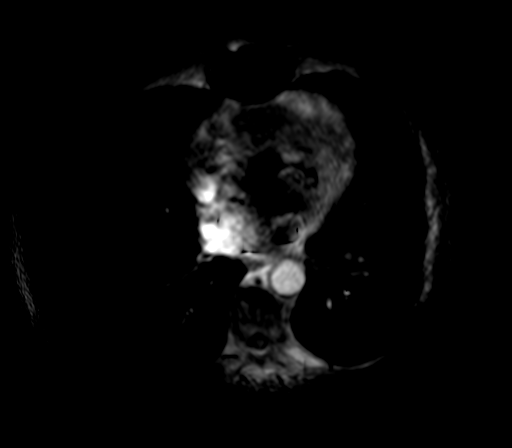

[Series 5: axial tru fisp · axial · 5.0mm · 1.41mm/px · 1 of 42 slices shown]
[im 1/42]
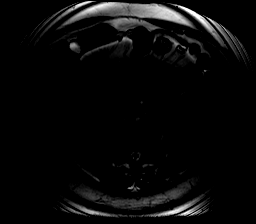

[Series 6: T2 · axial · 5.0mm · 0.70mm/px · 1 of 45 slices shown (3 of 3)]
[im 1/45]
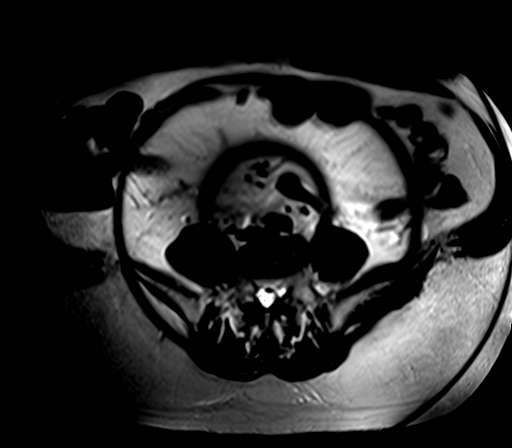

[Series 7: ep2d_diff_b50_500_800_p2_trig · axial · 5.0mm · 1.88mm/px · z∈[-85,+190]mm · 3 of 135 slices shown]
[im 1/135]
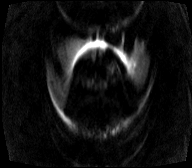
[im 68/135]
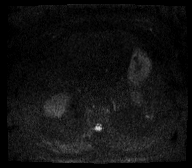
[im 135/135]
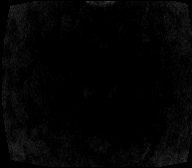

[Series 8: ep2d_[id number]_500_800_p2_trig_adc · axial · 5.0mm · 1.88mm/px · 1 of 45 slices shown]
[im 1/45]
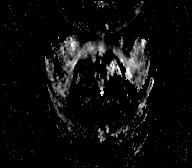

[Series 9: T1 dynamic · axial · non-contrast · 2.0mm · 0.78mm/px · z∈[-99,+123]mm · 3 of 112 slices shown]
[im 1/112]
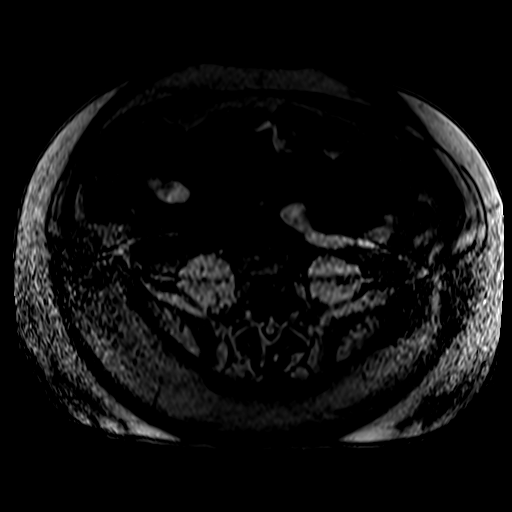
[im 56/112]
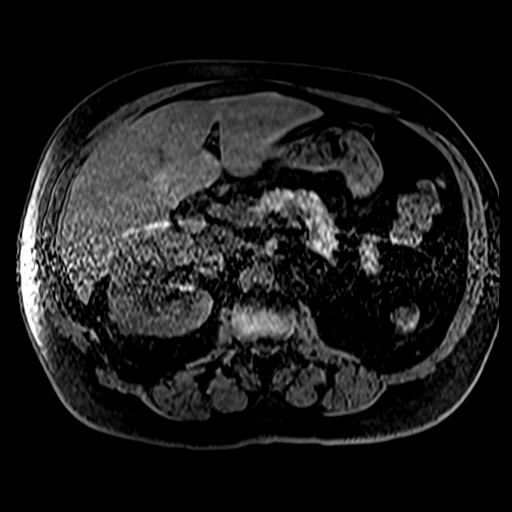
[im 112/112]
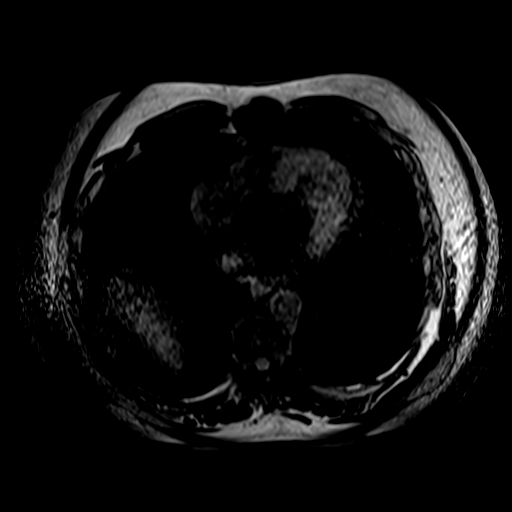

[Series 10: post 25 sec · axial · 2.0mm · 0.78mm/px · z∈[-99,+123]mm · 4 of 112 slices shown]
[im 1/112]
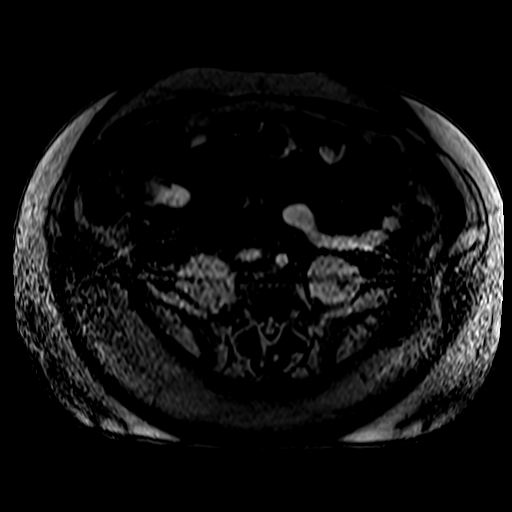
[im 38/112]
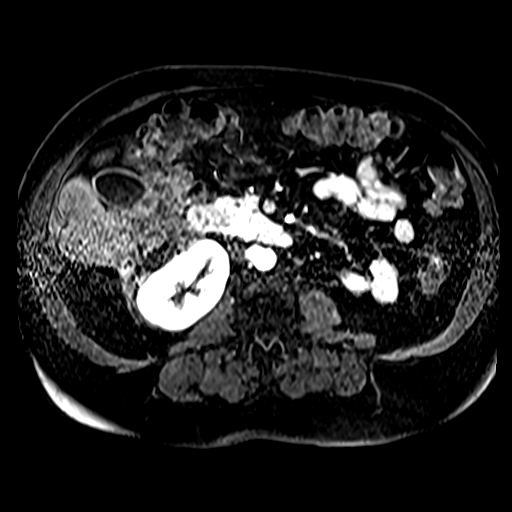
[im 75/112]
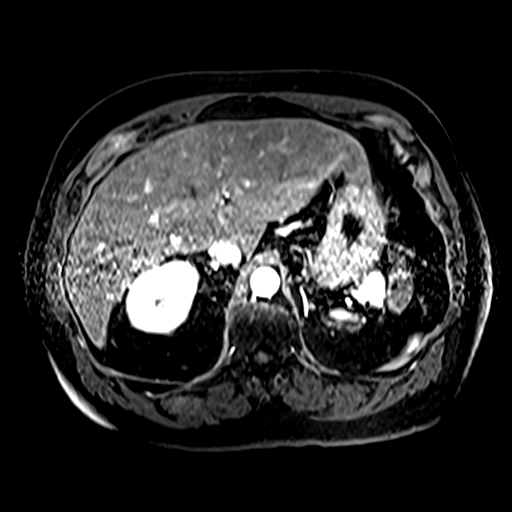
[im 112/112]
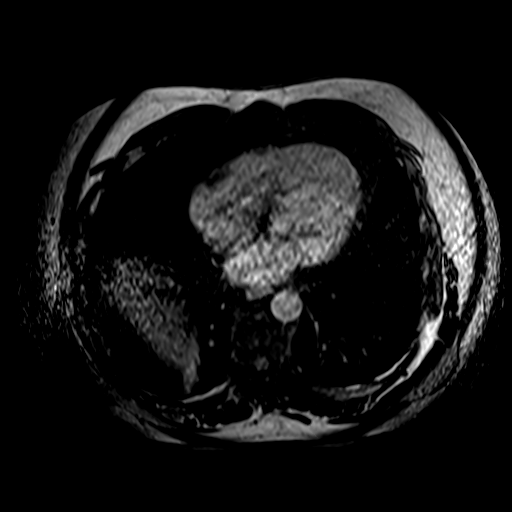

[Series 11: post 25 sec_sub · axial · 2.0mm · 0.78mm/px · z∈[-99,+123]mm · 4 of 112 slices shown]
[im 1/112]
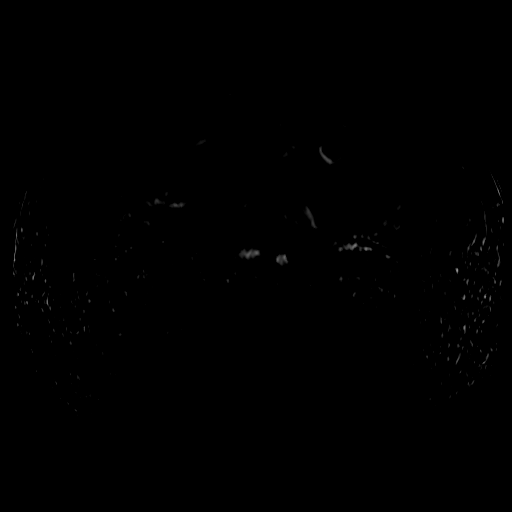
[im 38/112]
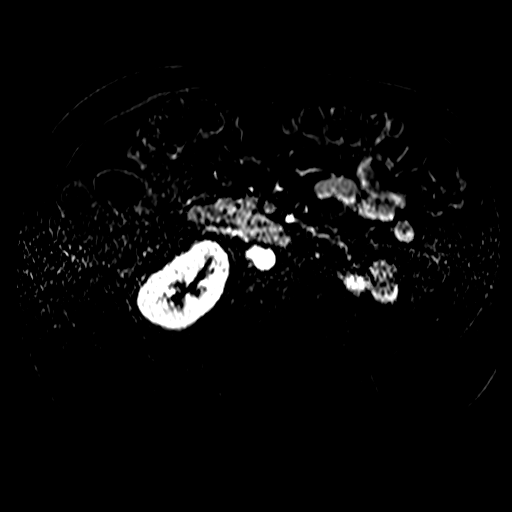
[im 75/112]
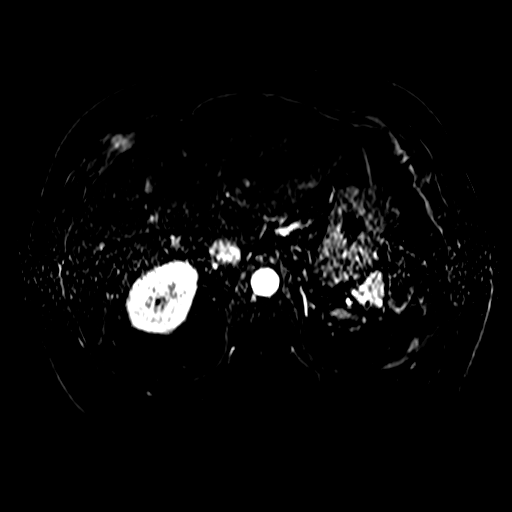
[im 112/112]
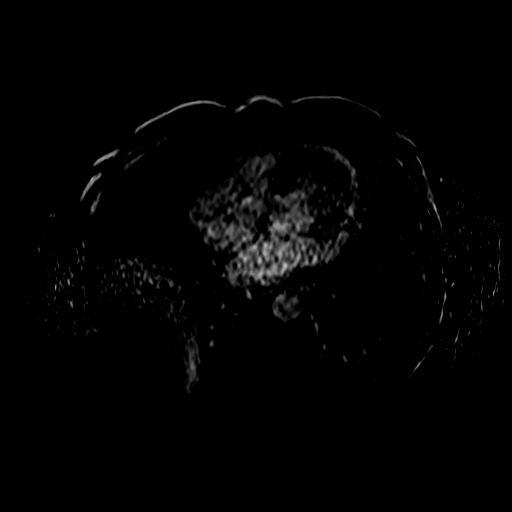

[Series 12: post 45 sec · axial · 2.0mm · 0.78mm/px · z∈[-99,+49]mm · 3 of 112 slices shown]
[im 1/112]
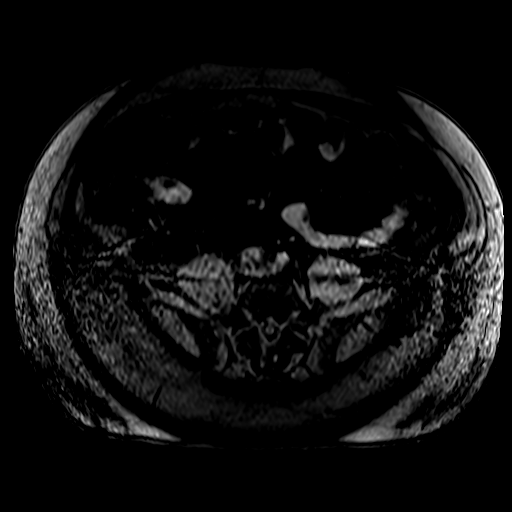
[im 38/112]
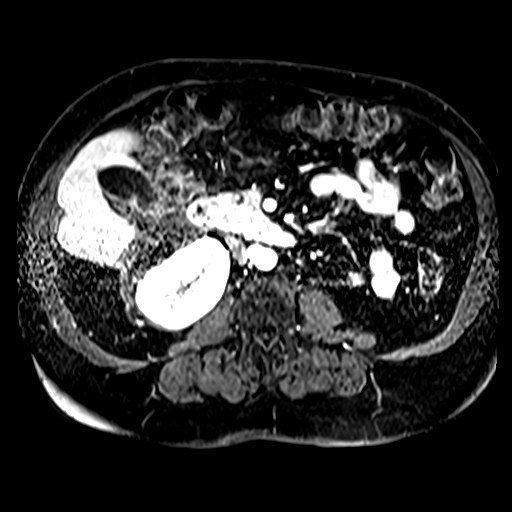
[im 75/112]
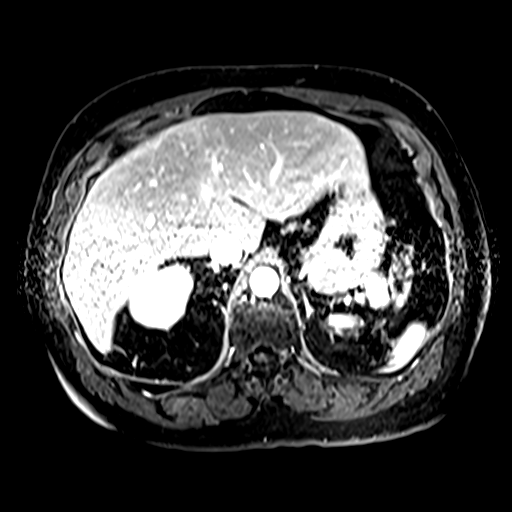

[24 of 48 positions shown; findings below may reference images not displayed]

FINDINGS: Lower chest: No acute findings.

Hepatobiliary: Hepatic steatosis. No mass or other parenchymal
abnormality identified.

Pancreas: No mass, inflammatory changes, or other parenchymal
abnormality identified.

Spleen:  Within normal limits in size and appearance.

Adrenals/Urinary Tract: There is an unusual, subcapsular lesion
about the anterior inferior pole of the right kidney, measuring
x 3.0 x 4.2 cm which appears to be well-circumscribed and
compressing the adjacent normal renal cortex, and containing
internal heterogeneous intrinsic T1 and T2 hyperintensity as well as
regions of internal fluid signal and diffusion characteristics.
There is no evidence of contrast enhancement. The left kidney is
extremely atrophic. No evidence of hydronephrosis.

Stomach/Bowel: Visualized portions within the abdomen are
unremarkable.

Vascular/Lymphatic: No pathologically enlarged lymph nodes
identified. Evidence of prior right renal artery bypass graft.

Other:  None.

Musculoskeletal: No suspicious bone lesions identified.
IMPRESSION: 1. Unusual, subcapsular lesion about the anterior inferior pole of
the right kidney measuring 7.2 cm, with heterogeneous internal
signal and diffusion characteristics, appearing to compress the
adjacent normal renal cortex. There is no evidence of contrast
enhancement. Findings are most consistent with sequelae of
subcapsular hemorrhage. Comparison to prior imaging, if available,
to assess for change over time could be helpful.

2.  Evidence of prior right renal artery bypass graft.

3.  Extremely atrophic left kidney, consistent with prior insult.

4.  Hepatic steatosis.

## 2021-07-14 ENCOUNTER — Other Ambulatory Visit: Payer: Self-pay | Admitting: Urology

## 2021-07-14 ENCOUNTER — Other Ambulatory Visit: Payer: Self-pay | Admitting: Diagnostic Radiology

## 2021-07-14 DIAGNOSIS — N281 Cyst of kidney, acquired: Secondary | ICD-10-CM

## 2021-07-27 ENCOUNTER — Other Ambulatory Visit: Payer: Self-pay

## 2021-07-27 ENCOUNTER — Ambulatory Visit
Admission: RE | Admit: 2021-07-27 | Discharge: 2021-07-27 | Disposition: A | Discharge status: Home / Self Care | Payer: Managed Care, Other (non HMO) | Source: Ambulatory Visit | Attending: Urology | Admitting: Urology

## 2021-07-27 DIAGNOSIS — N281 Cyst of kidney, acquired: Secondary | ICD-10-CM

## 2021-07-27 MED ORDER — GADOBENATE DIMEGLUMINE 529 MG/ML IV SOLN
20.0000 mL | Freq: Once | INTRAVENOUS | Status: AC | PRN
  Administered 2021-07-27: 20 mL via INTRAVENOUS

## 2022-06-23 ENCOUNTER — Other Ambulatory Visit: Payer: Self-pay | Admitting: Urology

## 2022-06-23 DIAGNOSIS — N281 Cyst of kidney, acquired: Secondary | ICD-10-CM

## 2022-07-10 ENCOUNTER — Ambulatory Visit
Admission: RE | Admit: 2022-07-10 | Discharge: 2022-07-10 | Disposition: A | Discharge status: Home / Self Care | Payer: BC Managed Care – PPO | Source: Ambulatory Visit | Attending: Urology | Admitting: Urology

## 2022-07-10 DIAGNOSIS — N281 Cyst of kidney, acquired: Secondary | ICD-10-CM

## 2022-07-10 MED ORDER — GADOPICLENOL 0.5 MMOL/ML IV SOLN
10.0000 mL | Freq: Once | INTRAVENOUS | Status: AC | PRN
  Administered 2022-07-10: 10 mL via INTRAVENOUS
